# Patient Record
Sex: Female | Born: 1995 | Race: Black or African American | Hispanic: No | Marital: Single | Smoking: Never smoker
Health system: Southern US, Community
[De-identification: ages and names within clinical notes are randomized; demographics above are authoritative.]

## PROBLEM LIST (undated history)

## (undated) DIAGNOSIS — J45909 Unspecified asthma, uncomplicated: Secondary | ICD-10-CM

## (undated) HISTORY — PX: TONSILLECTOMY: SUR1361

---
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
## 2017-03-24 ENCOUNTER — Encounter (HOSPITAL_COMMUNITY): Payer: Self-pay | Admitting: Emergency Medicine

## 2017-03-24 ENCOUNTER — Ambulatory Visit (HOSPITAL_COMMUNITY)
Admission: EM | Admit: 2017-03-24 | Discharge: 2017-03-24 | Disposition: A | Discharge status: Home / Self Care | Payer: BLUE CROSS/BLUE SHIELD | Attending: Internal Medicine | Admitting: Internal Medicine

## 2017-03-24 DIAGNOSIS — L03818 Cellulitis of other sites: Secondary | ICD-10-CM

## 2017-03-24 MED ORDER — CEPHALEXIN 500 MG PO CAPS
500.0000 mg | ORAL_CAPSULE | Freq: Three times a day (TID) | ORAL | 0 refills | Status: AC
Start: 2017-03-24 — End: 2017-03-31

## 2017-03-24 NOTE — Discharge Instructions (Signed)
Clean the wound once to twice a day gently with soap and water Take the antibiotic as prescribed Follow up with primary care doctor if wound does not clear up.

## 2017-03-24 NOTE — ED Provider Notes (Signed)
CSN: [id number]     Arrival date & time 03/24/17  1302 History   None    Chief Complaint  Patient presents with  . Wound Infection   (Consider location/radiation/quality/duration/timing/severity/associated sxs/prior Treatment) Patient is here with concern for ear infection secondary to ear piercing. She reports swelling and drainage at the site of piercing for the past week. She endorses mild pain as well but tolerable. She denies fever.       History reviewed. No pertinent past medical history. History reviewed. No pertinent surgical history. No family history on file. Social History  Substance Use Topics  . Smoking status: Never Smoker  . Smokeless tobacco: Not on file  . Alcohol use No   OB History    No data available     Review of Systems  Constitutional:       As stated in the HPI    Allergies  Patient has no known allergies.  Home Medications   Prior to Admission medications   Medication Sig Start Date End Date Taking? Authorizing Provider  cephALEXin (KEFLEX) 500 MG capsule Take 1 capsule (500 mg total) by mouth 3 (three) times daily. 03/24/17 03/31/17  Lucia Estelle, NP   Meds Ordered and Administered this Visit  Medications - No data to display  BP 114/67 (BP Location: Left Arm)   Pulse 73   Temp 98.6 F (37 C) (Oral)   Resp 16   LMP 02/25/2017   SpO2 100%  No data found.   Physical Exam  Constitutional: She is oriented to person, place, and time. She appears well-developed and well-nourished.  HENT:  Head: Normocephalic and atraumatic.  Cardiovascular: Normal rate.   Pulmonary/Chest: Effort normal.  Neurological: She is alert and oriented to person, place, and time.  Skin:  See picture below.  Noted to have some drainage at the site. No swelling noted. No significant pain on palpation.   Nursing note and vitals reviewed.     Urgent Care Course     Procedures (including critical care time)  Labs Review Labs Reviewed - No data to  display  Imaging Review No results found.  MDM   1. Cellulitis of other specified site    Will treat with keflex 500 mg TID x 7 days. Prescriptions given (see above). Reviewed directions for usage and side effects. Patient states understanding and will call with questions or problems. Patient instructed to call or follow up with his/her primary care doctor if failure to improve or change in symptoms. Discharge instruction given.     Lucia Estelle, NP 03/24/17 2158

## 2017-03-24 NOTE — ED Triage Notes (Signed)
Right ear piercing redness, swelling and drainage.  Noticed symptoms for the past week

## 2017-08-28 ENCOUNTER — Encounter (HOSPITAL_COMMUNITY): Payer: Self-pay | Admitting: Emergency Medicine

## 2017-08-28 ENCOUNTER — Ambulatory Visit (HOSPITAL_COMMUNITY)
Admission: EM | Admit: 2017-08-28 | Discharge: 2017-08-28 | Disposition: A | Discharge status: Home / Self Care | Payer: BLUE CROSS/BLUE SHIELD | Attending: Family Medicine | Admitting: Family Medicine

## 2017-08-28 DIAGNOSIS — J029 Acute pharyngitis, unspecified: Secondary | ICD-10-CM | POA: Insufficient documentation

## 2017-08-28 DIAGNOSIS — R0982 Postnasal drip: Secondary | ICD-10-CM | POA: Diagnosis not present

## 2017-08-28 DIAGNOSIS — J028 Acute pharyngitis due to other specified organisms: Secondary | ICD-10-CM | POA: Diagnosis not present

## 2017-08-28 LAB — POCT RAPID STREP A: Streptococcus, Group A Screen (Direct): NEGATIVE

## 2017-08-28 NOTE — ED Triage Notes (Signed)
Pt reports nasal congestion and a sore throat for two days.

## 2017-08-28 NOTE — ED Provider Notes (Addendum)
[facility]    CSN: [id number] Arrival date & time: 08/28/17  1437     History   Chief Complaint Chief Complaint  Patient presents with  . URI    HPI Jamie Combs is a 21 y.o. female.   21 year old female states she developed sore throat 2 days ago. She said mild nasal congestion and having to clear her throat frequently. Denies fever or chills. She states her voice is changing.       History reviewed. No pertinent past medical history.  There are no active problems to display for this patient.   History reviewed. No pertinent surgical history.  OB History    No data available       Home Medications    Prior to Admission medications   Not on File    Family History History reviewed. No pertinent family history.  Social History Social History  Substance Use Topics  . Smoking status: Never Smoker  . Smokeless tobacco: Never Used  . Alcohol use No     Allergies   Patient has no known allergies.   Review of Systems Review of Systems  Constitutional: Negative.   HENT: Positive for congestion and sore throat.   Respiratory: Negative.   Cardiovascular: Negative.   Gastrointestinal: Negative.   Neurological: Negative.   All other systems reviewed and are negative.    Physical Exam Triage Vital Signs ED Triage Vitals  Enc Vitals Group     BP 08/28/17 1507 118/71     Pulse Rate 08/28/17 1507 99     Resp --      Temp 08/28/17 1507 98.8 F (37.1 C)     Temp Source 08/28/17 1507 Oral     SpO2 08/28/17 1507 100 %     Weight --      Height --      Head Circumference --      Peak Flow --      Pain Score 08/28/17 1508 7     Pain Loc --      Pain Edu? --      Excl. in GC? --    No data found.   Updated Vital Signs BP 118/71 (BP Location: Left Arm)   Pulse 99   Temp 98.8 F (37.1 C) (Oral)   LMP 08/14/2017 (Approximate)   SpO2 100%   Visual Acuity Right Eye Distance:   Left Eye Distance:   Bilateral Distance:     Right Eye Near:   Left Eye Near:    Bilateral Near:     Physical Exam  Constitutional: She is oriented to person, place, and time. She appears well-developed. No distress.  HENT:  Mouth/Throat: No oropharyngeal exudate.  Bilateral TMs are normal. Oropharynx with smooth erythema. Evidence of scant clear PND otherwise normal.  Eyes: EOM are normal.  Neck: Normal range of motion. Neck supple.  Cardiovascular: Normal rate, regular rhythm, normal heart sounds and intact distal pulses.   Pulmonary/Chest: Effort normal and breath sounds normal. No respiratory distress.  Lymphadenopathy:    She has no cervical adenopathy.  Neurological: She is alert and oriented to person, place, and time.  Skin: Skin is warm and dry. No rash noted.  Nursing note and vitals reviewed.    UC Treatments / Results  Labs (all labs ordered are listed, but only abnormal results are displayed) Labs Reviewed  POCT RAPID STREP A    EKG  EKG Interpretation None       Radiology No results found.  Procedures Procedures (including critical care time)  Medications Ordered in UC Medications - No data to display   Initial Impression / Assessment and Plan / UC Course  I have reviewed the triage vital signs and the nursing notes.  Pertinent labs & imaging results that were available during my care of the patient were reviewed by me and considered in my medical decision making (see chart for details).     The strep test is negative. It will be cultured and if positive for strep and about 3 days she will be called and can be treated over the phone. The likely to reasons for the sore throat is a combination of viral infection which is the most common type of sore throat and drainage in the back of the throat which can irritate her throat and make it painful. Ibuprofen 600 mg every 6 hours as needed, Tylenol every 4 hours for pain if needed, Cepacol lozenges which helped numb up the back of the throat and  plenty of liquids.   Final Clinical Impressions(s) / UC Diagnoses   Final diagnoses:  Acute pharyngitis due to other specified organisms  PND (post-nasal drip)    New Prescriptions New Prescriptions   No medications on file     Controlled Substance Prescriptions [place] Controlled Substance Registry consulted? Not Applicable   Hayden Rasmussen, NP 08/28/17 1618    Hayden Rasmussen, NP 08/28/17 1625

## 2017-08-28 NOTE — Discharge Instructions (Signed)
The strep test is negative. It will be cultured and if positive for strep and about 3 days she will be called and can be treated over the phone. The likely to reasons for the sore throat is a combination of viral infection which is the most common type of sore throat and drainage in the back of the throat which can irritate her throat and make it painful. Ibuprofen 600 mg every 6 hours as needed, Tylenol every 4 hours for pain if needed, Cepacol lozenges which helped numb up the back of the throat and plenty of liquids.

## 2017-08-31 LAB — CULTURE, GROUP A STREP (THRC)

## 2018-07-28 ENCOUNTER — Other Ambulatory Visit: Payer: Self-pay

## 2018-07-28 ENCOUNTER — Emergency Department (HOSPITAL_COMMUNITY)
Admission: EM | Admit: 2018-07-28 | Discharge: 2018-07-29 | Disposition: A | Discharge status: Home / Self Care | Payer: BLUE CROSS/BLUE SHIELD | Attending: Emergency Medicine | Admitting: Emergency Medicine

## 2018-07-28 ENCOUNTER — Encounter (HOSPITAL_COMMUNITY): Payer: Self-pay | Admitting: Emergency Medicine

## 2018-07-28 ENCOUNTER — Emergency Department (HOSPITAL_COMMUNITY): Payer: BLUE CROSS/BLUE SHIELD

## 2018-07-28 DIAGNOSIS — K122 Cellulitis and abscess of mouth: Secondary | ICD-10-CM | POA: Insufficient documentation

## 2018-07-28 DIAGNOSIS — J45909 Unspecified asthma, uncomplicated: Secondary | ICD-10-CM | POA: Diagnosis not present

## 2018-07-28 DIAGNOSIS — R0602 Shortness of breath: Secondary | ICD-10-CM | POA: Diagnosis not present

## 2018-07-28 DIAGNOSIS — R070 Pain in throat: Secondary | ICD-10-CM | POA: Diagnosis present

## 2018-07-28 HISTORY — DX: Unspecified asthma, uncomplicated: J45.909

## 2018-07-28 NOTE — ED Triage Notes (Signed)
Pt reports having sore throat x2 days. She developed SHOB today, worsening on exertion. Pt also reports difficulty swallowing. Pt denies fever.

## 2018-07-29 LAB — GROUP A STREP BY PCR: GROUP A STREP BY PCR: NOT DETECTED

## 2018-07-29 MED ORDER — DEXAMETHASONE 4 MG PO TABS
8.0000 mg | ORAL_TABLET | Freq: Once | ORAL | Status: AC
  Administered 2018-07-29: 8 mg via ORAL
  Filled 2018-07-29: qty 2

## 2018-07-29 MED ORDER — CLINDAMYCIN HCL 150 MG PO CAPS
300.0000 mg | ORAL_CAPSULE | Freq: Once | ORAL | Status: AC
  Administered 2018-07-29: 300 mg via ORAL
  Filled 2018-07-29: qty 2

## 2018-07-29 MED ORDER — CLINDAMYCIN HCL 300 MG PO CAPS
300.0000 mg | ORAL_CAPSULE | Freq: Four times a day (QID) | ORAL | 0 refills | Status: DC
Start: 2018-07-29 — End: 2021-02-08

## 2018-07-29 NOTE — ED Notes (Signed)
Pt given water and sprite for fluid challenge. Pt tolerating well. Will continue to monitor.

## 2018-07-29 NOTE — ED Notes (Signed)
Pt verbalizes understanding of d/c instructions. Pt received prescriptions. Pt ambulatory at d/c with all belongings and with family.   

## 2018-07-29 NOTE — ED Provider Notes (Signed)
[facility]   CSN: [id number] Arrival date & time: 07/28/18  2259     History   Chief Complaint Chief Complaint  Patient presents with  . Sore Throat  . Shortness of Breath    HPI Stephani Memory Argueance is a 22 y.o. female.  The history is provided by the patient.  Sore Throat  This is a new problem. The current episode started more than 2 days ago. The problem occurs daily. The problem has been gradually worsening. Associated symptoms include shortness of breath. Pertinent negatives include no chest pain. The symptoms are aggravated by swallowing. The symptoms are relieved by rest.  Shortness of Breath  Associated symptoms include sore throat. Pertinent negatives include no fever and no chest pain.  Presents with worsening sore throat the past several days.  No cough or fever.  No vomiting.  She does report mild shortness of breath with swallowing.  No drooling.  She is able to take p.o. fluids.  No sick contacts.  Past Medical History:  Diagnosis Date  . Asthma     There are no active problems to display for this patient.   Past Surgical History:  Procedure Laterality Date  . TONSILLECTOMY       OB History   None      Home Medications    Prior to Admission medications   Not on File    Family History History reviewed. No pertinent family history.  Social History Social History   Tobacco Use  . Smoking status: Never Smoker  . Smokeless tobacco: Never Used  Substance Use Topics  . Alcohol use: No  . Drug use: No     Allergies   Patient has no known allergies.   Review of Systems Review of Systems  Constitutional: Negative for fever.  HENT: Positive for sore throat. Negative for drooling.   Respiratory: Positive for shortness of breath.   Cardiovascular: Negative for chest pain.  All other systems reviewed and are negative.    Physical Exam Updated Vital Signs BP (!) 144/93   Pulse 92    Temp 99.5 F (37.5 C) (Oral)   Resp (!) 22   Ht 1.727 m (5\' 8" )   Wt 72.6 kg   LMP 07/14/2018   SpO2 100%   BMI 24.33 kg/m   Physical Exam CONSTITUTIONAL: Well developed/well nourished HEAD: Normocephalic/atraumatic EYES: EOMI/PERRL ENMT: Mucous membranes moist, uvula midline with mild edema, no exudates, no stridor, no drooling NECK: supple no meningeal signs, no anterior neck edema, no cervical lymphadenopathy SPINE/BACK:entire spine nontender CV: S1/S2 noted, no murmurs/rubs/gallops noted LUNGS: Lungs are clear to auscultation bilaterally, no apparent distress ABDOMEN: soft, nontender NEURO: Pt is awake/alert/appropriate, moves all extremitiesx4.  No facial droop.   EXTREMITIES: pulses normal/equal, full ROM SKIN: warm, color normal PSYCH: no abnormalities of mood noted, alert and oriented to situation   ED Treatments / Results  Labs (all labs ordered are listed, but only abnormal results are displayed) Labs Reviewed  GROUP A STREP BY PCR    EKG EKG Interpretation  Date/Time:  Sunday July 28 2018 23:05:37 EDT Ventricular Rate:  120 PR Interval:  144 QRS Duration: 70 QT Interval:  292 QTC Calculation: 412 R Axis:   89 Text Interpretation:  Sinus tachycardia Nonspecific T wave abnormality Abnormal ECG No previous ECGs available Confirmed by Zadie RhineWickline, Pebble Botkin ([id number]) on 07/29/2018 1:53:26 AM   Radiology Dg Chest 2 View  Result Date: 07/28/2018 CLINICAL DATA:  Sore throat for 2  days with shortness of breath EXAM: CHEST - 2 VIEW COMPARISON:  None. FINDINGS: The heart size and mediastinal contours are within normal limits. Both lungs are clear. The visualized skeletal structures are unremarkable. IMPRESSION: No active cardiopulmonary disease. Electronically Signed   By: Alcide CleverMark  Lukens M.D.   On: 07/28/2018 23:31    Procedures Procedures   Medications Ordered in ED Medications  dexamethasone (DECADRON) tablet 8 mg (8 mg Oral Given 07/29/18 0130)  clindamycin  (CLEOCIN) capsule 300 mg (300 mg Oral Given 07/29/18 0130)     Initial Impression / Assessment and Plan / ED Course  I have reviewed the triage vital signs and the nursing notes.  Pertinent labs & imaging results that were available during my care of the patient were reviewed by me and considered in my medical decision making (see chart for details).     PT Presents with sore throat for several days.  She is stable appearing.  She may have mild uvulitis.  She was given a dose of steroids and oral antibiotics and she has tolerated this well.  Her neck is supple, no drooling, no stridor.  No signs of acute impending airway compromise. Will refer to ENT.  We discussed strict return precautions Final Clinical Impressions(s) / ED Diagnoses   Final diagnoses:  Uvulitis    ED Discharge Orders         Ordered    clindamycin (CLEOCIN) 300 MG capsule  4 times daily     07/29/18 0201           Zadie RhineWickline, Takera Rayl, MD 07/29/18 0202

## 2018-09-02 ENCOUNTER — Ambulatory Visit: Payer: Self-pay | Admitting: Internal Medicine

## 2019-08-20 IMAGING — DX DG CHEST 2V
2 series · 2 of 2 positions shown · non-contrast
Comparison: None.

CLINICAL DATA: Sore throat for 2 days with shortness of breath

EXAM:
CHEST - 2 VIEW

[chest pa]
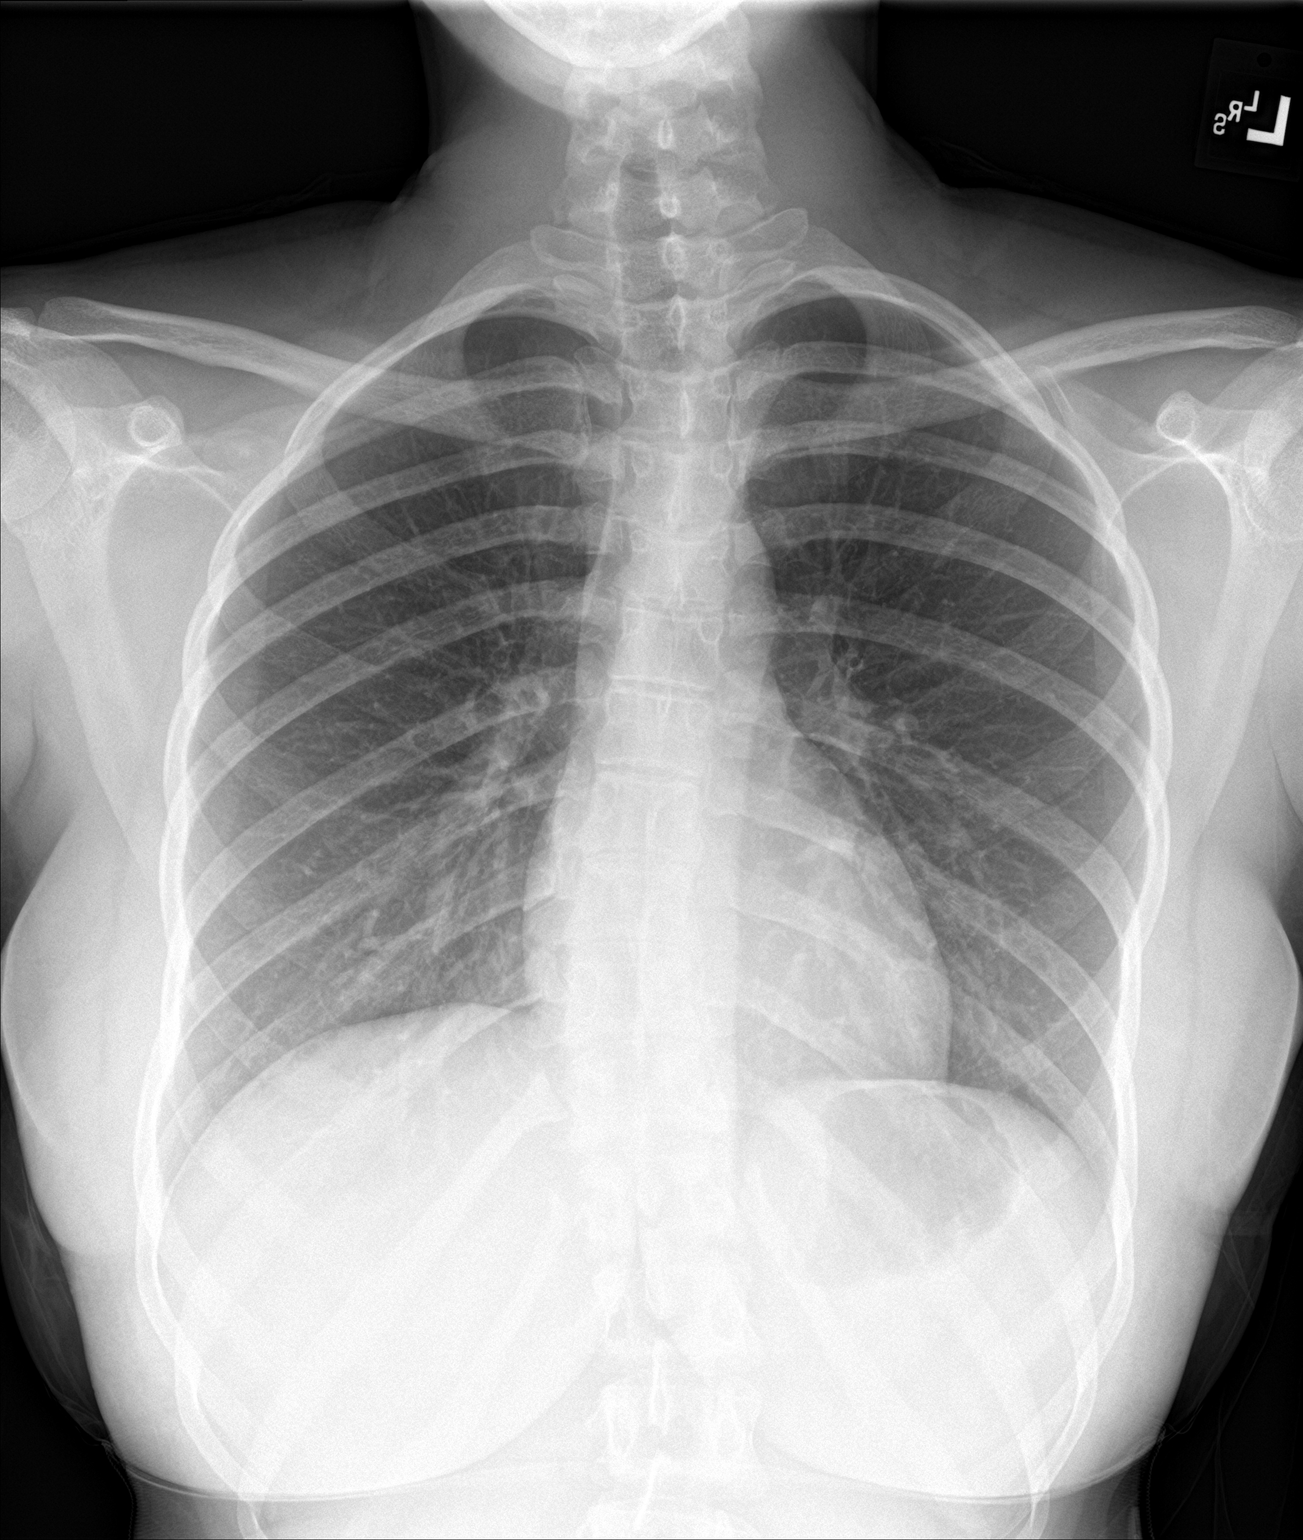

[chest lat]
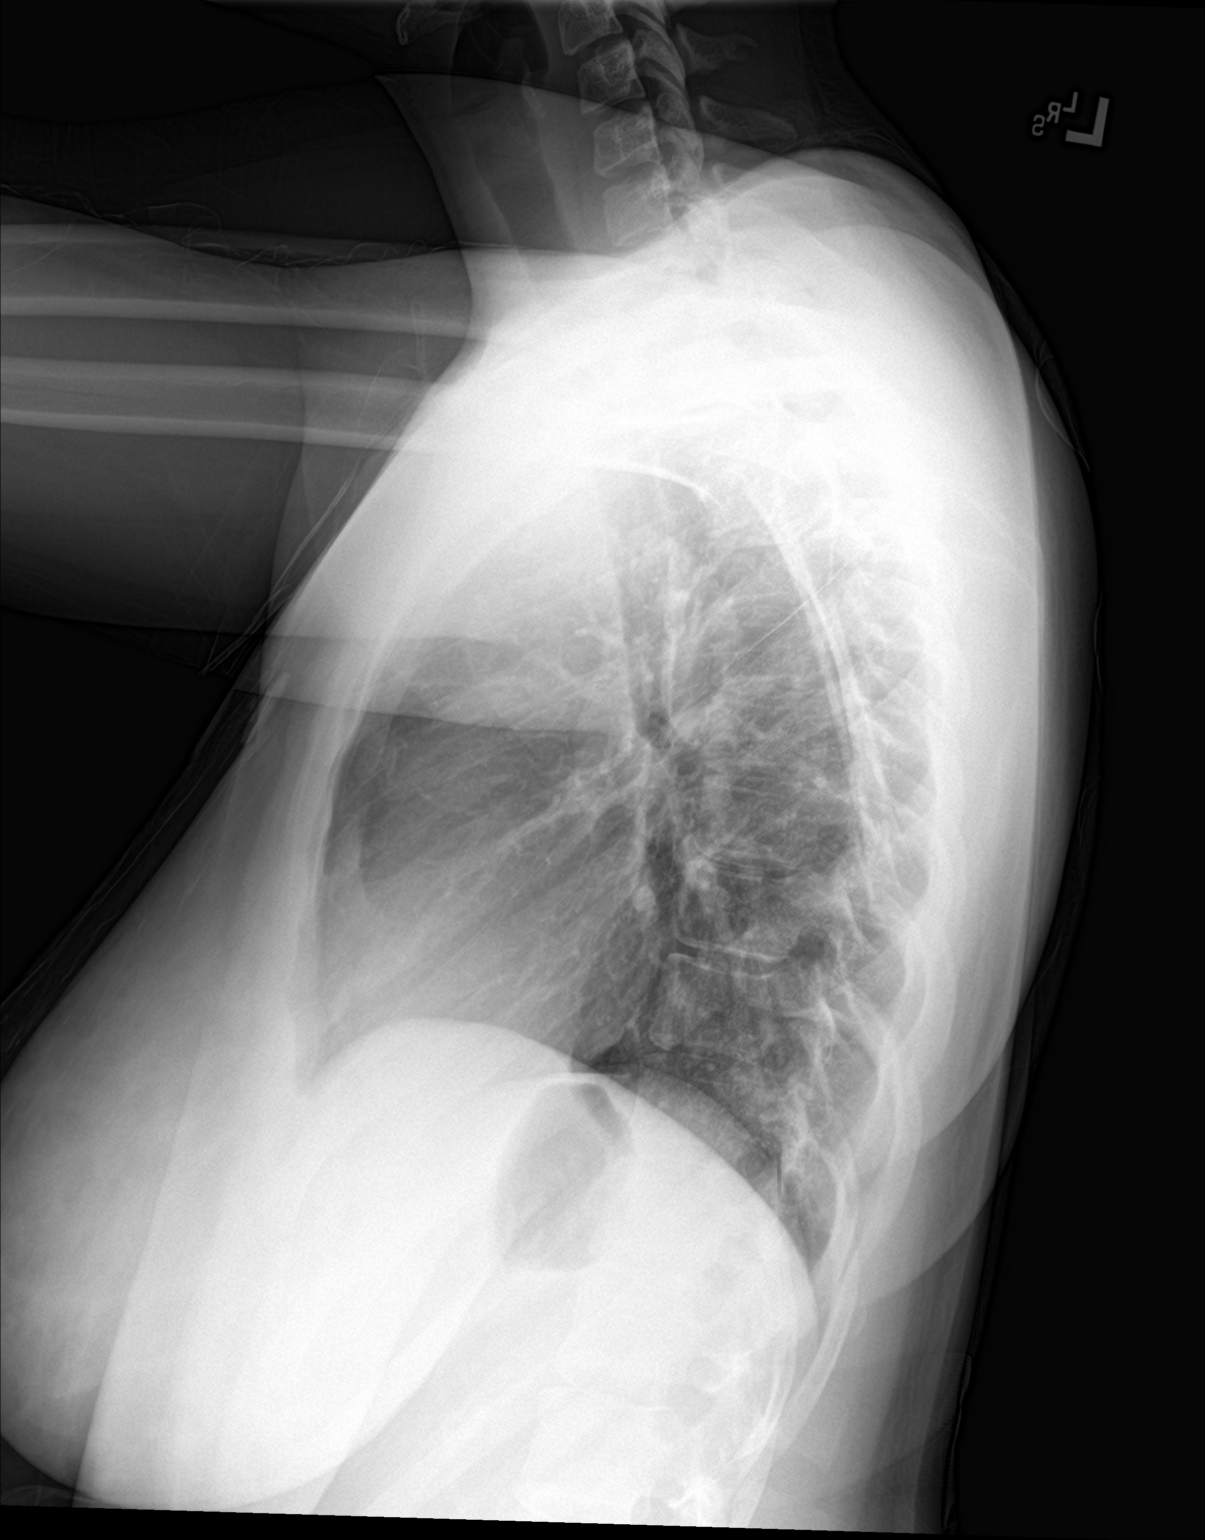

[2 of 2 positions shown; findings below may reference images not displayed]

FINDINGS: The heart size and mediastinal contours are within normal limits.
Both lungs are clear. The visualized skeletal structures are
unremarkable.
IMPRESSION: No active cardiopulmonary disease.

## 2021-02-08 ENCOUNTER — Other Ambulatory Visit: Payer: Self-pay

## 2021-02-08 ENCOUNTER — Emergency Department (HOSPITAL_BASED_OUTPATIENT_CLINIC_OR_DEPARTMENT_OTHER)
Admission: EM | Admit: 2021-02-08 | Discharge: 2021-02-08 | Discharge status: Against Medical Advice | Payer: BLUE CROSS/BLUE SHIELD | Attending: Emergency Medicine | Admitting: Emergency Medicine

## 2021-02-08 ENCOUNTER — Encounter (HOSPITAL_BASED_OUTPATIENT_CLINIC_OR_DEPARTMENT_OTHER): Payer: Self-pay | Admitting: Emergency Medicine

## 2021-02-08 DIAGNOSIS — F3011 Manic episode without psychotic symptoms, mild: Secondary | ICD-10-CM

## 2021-02-08 DIAGNOSIS — J45909 Unspecified asthma, uncomplicated: Secondary | ICD-10-CM | POA: Insufficient documentation

## 2021-02-08 LAB — RAPID URINE DRUG SCREEN, HOSP PERFORMED
Amphetamines: NOT DETECTED
Barbiturates: NOT DETECTED
Benzodiazepines: NOT DETECTED
Cocaine: NOT DETECTED
Opiates: NOT DETECTED
Tetrahydrocannabinol: POSITIVE — AB

## 2021-02-08 LAB — PREGNANCY, URINE: Preg Test, Ur: NEGATIVE

## 2021-02-08 NOTE — ED Notes (Signed)
Pt seen walking out of department.

## 2021-02-08 NOTE — ED Notes (Signed)
Pt's visitor to desk. Explained process to talk to TTS and reason for wait. Visitor verbalized understanding.

## 2021-02-08 NOTE — ED Notes (Signed)
Spoke with family member on the phone  Family member states the pt is wanting to leave  Explained to family that we could not make the pt stay as she was her voluntarily  Family member wanting an explanation of why it was taking so long for her to have her consult  Explained to family the process of TTS consults and that they were responsible for all TTS consults in the [facility] system therefore it could take hours to have this completed

## 2021-02-08 NOTE — ED Provider Notes (Signed)
[facility] Provider Note: Jamie Dell, MD, FACEP  CSN: [id number] MRN: [id number] ARRIVAL: 02/08/21 at 0055 ROOM: [facility]01/[facility]01   CHIEF COMPLAINT  Sleeping Problem   HISTORY OF PRESENT ILLNESS  02/08/21 2:10 AM Jamie Combs is a 25 y.o. female who is felt depressed and anxious for the past 4 days.  She has not slept in over 24 hours.  Her mother, who is on a conference call from [place], relates that the patient has had pressured speech with flight of ideas and difficulty concentrating.  She has not been eating or drinking fluids adequately.  The patient denies SI or HI.  She feels like she has been under stress with two deaths in the family recently.  She states yesterday was her birthday but it was more stressful for her than celebratory.  She is currently having some irregular bleeding and menstrual cramps which she attributes to not taking her birth control pills properly.  She denies drug use.   Past Medical History:  Diagnosis Date  . Asthma     Past Surgical History:  Procedure Laterality Date  . TONSILLECTOMY      No family history on file.  Social History   Tobacco Use  . Smoking status: Never Smoker  . Smokeless tobacco: Never Used  Substance Use Topics  . Alcohol use: No  . Drug use: No    Prior to Admission medications   Not on File    Allergies Patient has no known allergies.   REVIEW OF SYSTEMS  Negative except as noted here or in the History of Present Illness.   PHYSICAL EXAMINATION  Initial Vital Signs Blood pressure 129/90, pulse 66, resp. rate 16, height 5\' 8"  (1.727 m), weight 65.8 kg, SpO2 100 %.  Examination General: Well-developed, well-nourished female in no acute distress; appearance consistent with age of record HENT: normocephalic; atraumatic Eyes: pupils equal, round and reactive to light; extraocular muscles intact Neck: supple Heart: regular rate and rhythm Lungs: clear to auscultation  bilaterally Abdomen: soft; nondistended; nontender; bowel sounds present Extremities: No deformity; full range of motion; pulses normal Neurologic: Awake, alert and oriented; motor function intact in all extremities and symmetric; no facial droop Skin: Warm and dry Psychiatric: Anxious and depressed mood with congruent affect; mildly pressured speech   RESULTS  Summary of this visit's results, reviewed and interpreted by myself:   EKG Interpretation  Date/Time:    Ventricular Rate:    PR Interval:    QRS Duration:   QT Interval:    QTC Calculation:   R Axis:     Text Interpretation:        Laboratory Studies: Results for orders placed or performed during the hospital encounter of 02/08/21 (from the past 24 hour(s))  Pregnancy, urine     Status: None   Collection Time: 02/08/21  1:55 AM  Result Value Ref Range   Preg Test, Ur NEGATIVE NEGATIVE  Rapid urine drug screen (hospital performed)     Status: Abnormal   Collection Time: 02/08/21  1:55 AM  Result Value Ref Range   Opiates NONE DETECTED NONE DETECTED   Cocaine NONE DETECTED NONE DETECTED   Benzodiazepines NONE DETECTED NONE DETECTED   Amphetamines NONE DETECTED NONE DETECTED   Tetrahydrocannabinol POSITIVE (A) NONE DETECTED   Barbiturates NONE DETECTED NONE DETECTED   Imaging Studies: No results found.  ED COURSE and MDM  Nursing notes, initial and subsequent vitals signs, including pulse oximetry, reviewed and interpreted by myself.  Vitals:  02/08/21 0102 02/08/21 0103  BP: 129/90   Pulse: 66   Resp: 16   TempSrc: Oral   SpO2: 100%   Weight:  65.8 kg  Height:  5\' 8"  (1.727 m)   Medications - No data to display  4:28 AM Patient eloped without informing staff.  PROCEDURES  Procedures   ED DIAGNOSES     ICD-10-CM   1. 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

## 2021-02-08 NOTE — ED Triage Notes (Addendum)
States she has not been able to sleep. Pt admits to increased stress. Pt states, "I'm having a manic episode." pt has no hx of this.
